# Patient Record
Sex: Male | Born: 1955 | Race: White | Hispanic: No | Marital: Single | Smoking: Never smoker
Health system: Southern US, Community
[De-identification: ages and names within clinical notes are randomized; demographics above are authoritative.]

## PROBLEM LIST (undated history)

## (undated) DIAGNOSIS — S069X9A Unspecified intracranial injury with loss of consciousness of unspecified duration, initial encounter: Secondary | ICD-10-CM

## (undated) DIAGNOSIS — S069XAA Unspecified intracranial injury with loss of consciousness status unknown, initial encounter: Secondary | ICD-10-CM

## (undated) HISTORY — PX: VASCULAR SURGERY: SHX849

---

## 2011-07-12 ENCOUNTER — Ambulatory Visit: Payer: Self-pay

## 2012-02-20 ENCOUNTER — Emergency Department: Payer: Self-pay | Admitting: Emergency Medicine

## 2014-08-07 LAB — CBC WITH DIFFERENTIAL/PLATELET
Basophil #: 0.1 10*3/uL (ref 0.0–0.1)
Basophil %: 1 %
EOS PCT: 4.1 %
Eosinophil #: 0.2 10*3/uL (ref 0.0–0.7)
HCT: 40.1 % (ref 40.0–52.0)
HGB: 13.8 g/dL (ref 13.0–18.0)
LYMPHS PCT: 55.5 %
Lymphocyte #: 3.3 10*3/uL (ref 1.0–3.6)
MCH: 31.5 pg (ref 26.0–34.0)
MCHC: 34.4 g/dL (ref 32.0–36.0)
MCV: 92 fL (ref 80–100)
MONO ABS: 0.7 x10 3/mm (ref 0.2–1.0)
Monocyte %: 11.9 %
NEUTROS ABS: 1.6 10*3/uL (ref 1.4–6.5)
NEUTROS PCT: 27.5 %
PLATELETS: 222 10*3/uL (ref 150–440)
RBC: 4.38 10*6/uL — ABNORMAL LOW (ref 4.40–5.90)
RDW: 13 % (ref 11.5–14.5)
WBC: 6 10*3/uL (ref 3.8–10.6)

## 2014-08-07 LAB — BASIC METABOLIC PANEL
ANION GAP: 9 (ref 7–16)
BUN: 7 mg/dL (ref 7–18)
CALCIUM: 8.9 mg/dL (ref 8.5–10.1)
CREATININE: 0.85 mg/dL (ref 0.60–1.30)
Chloride: 109 mmol/L — ABNORMAL HIGH (ref 98–107)
Co2: 24 mmol/L (ref 21–32)
EGFR (African American): >60
EGFR (Non-African Amer.): >60
GLUCOSE: 97 mg/dL (ref 65–99)
OSMOLALITY: 281 (ref 275–301)
Potassium: 4 mmol/L (ref 3.5–5.1)
Sodium: 142 mmol/L (ref 136–145)

## 2014-08-07 LAB — HEPATIC FUNCTION PANEL A (ARMC)
ALBUMIN: 3.5 g/dL (ref 3.4–5.0)
Alkaline Phosphatase: 57 U/L
BILIRUBIN TOTAL: 0.3 mg/dL (ref 0.2–1.0)
Bilirubin, Direct: <0.1 mg/dL (ref 0.00–0.20)
SGOT(AST): 33 U/L (ref 15–37)
SGPT (ALT): 31 U/L
Total Protein: 6.8 g/dL (ref 6.4–8.2)

## 2014-08-07 LAB — CK: CK, Total: 406 U/L — ABNORMAL HIGH

## 2014-08-07 LAB — TROPONIN I
Troponin-I: <0.02 ng/mL
Troponin-I: <0.02 ng/mL

## 2014-08-08 ENCOUNTER — Observation Stay: Payer: Self-pay | Admitting: Specialist

## 2014-08-08 LAB — CK TOTAL AND CKMB (NOT AT ARMC)
CK, Total: 399 U/L — ABNORMAL HIGH
CK, Total: 319 U/L — ABNORMAL HIGH
CK-MB: 7.9 ng/mL — AB (ref 0.5–3.6)
CK-MB: 5.8 ng/mL — ABNORMAL HIGH (ref 0.5–3.6)

## 2014-08-08 LAB — TROPONIN I: Troponin-I: <0.02 ng/mL

## 2014-08-08 LAB — CK-MB: CK-MB: 7.3 ng/mL — AB (ref 0.5–3.6)

## 2015-03-25 NOTE — Discharge Summary (Signed)
PATIENT NAME:  Anthony Middleton, Anthony Middleton MR#:  [id number] DATE OF BIRTH:  [birth year]  DATE OF ADMISSION:  08/08/2014 DATE OF DISCHARGE:  08/08/2014  For a detailed note, please take a look at the history and physical done on admission by Dr. Heron NayVasireddy.   DIAGNOSES AT DISCHARGE: Syncope, likely vasovagal in nature, history of bipolar disorder and depression, neuropathy, chronic pain.   The patient is being discharged on a regular diet.   ACTIVITY: As tolerated.   FOLLOWUP: With his primary care physician in the next 1-2 weeks.   DISCHARGE MEDICATIONS: Depakote 2 tabs at bedtime, Seroquel 2 tabs at bedtime, Celexa 2 tabs daily, gabapentin 500 mg t.i.d., fish oil daily, turmeric 2 caps daily, vitamin D2 1000 international units daily; potassium supplement 1 tab daily, multivitamin daily.   PERTINENT STUDIES DONE DURING THE HOSPITAL COURSE: Are as follows: A chest x-ray done on admission showing no acute cardiopulmonary process. An ultrasound of the carotids showing no evidence of any hemodynamically significant carotid artery stenosis. A 2-dimensional echocardiogram showing ejection fraction of 50-55% with low normal global LV systolic function.   HOSPITAL COURSE: This is a [age] year old male who presented to the hospital with a syncopal episode.   Problem #1:  Syncope. The most likely cause of the patient's syncope was vasovagal in nature. He had some subtle EKG changes, therefore, was observed overnight on telemetry. He had 3 sets of cardiac markers checked, which were negative. He had no evidence of any acute arrhythmias. He had a carotid duplex checked, which was negative. His echocardiogram showed normal LV systolic function with no structural heart disease. The patient has had no further episodes of syncope. His orthostatic vital signs are negative and, therefore, he is being discharged home.   Problem #2:  Bipolar disorder/depression. The patient was maintained on his Depakote and Seroquel. He will resume  that. Problem #3:  Neuropathy. The patient was maintained on Neurontin. He will resume that.  Problem #4:  Depression. The patient was maintained on Celexa and he will resume that upon discharge.   CODE STATUS: The patient is a full code.   Time spent on discharge was 35 minutes.    ____________________________ [name]. Cherlynn KaiserSainani, MD vjs:ts D: 08/08/2014 14:50:00 ET T: 08/08/2014 21:17:48 ET JOB#: [id number]  cc: [name]. Cherlynn KaiserSainani, MD, <Dictator> Houston SirenVIVEK Middleton [name] MD ELECTRONICALLY SIGNED 08/10/2014 15:55
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
## 2016-07-23 ENCOUNTER — Emergency Department
Admission: EM | Admit: 2016-07-23 | Discharge: 2016-07-23 | Disposition: A | Discharge status: Home / Self Care | Payer: Medicare Other | Attending: Emergency Medicine | Admitting: Emergency Medicine

## 2016-07-23 ENCOUNTER — Emergency Department: Payer: Medicare Other

## 2016-07-23 ENCOUNTER — Encounter: Payer: Self-pay | Admitting: Emergency Medicine

## 2016-07-23 DIAGNOSIS — Y939 Activity, unspecified: Secondary | ICD-10-CM | POA: Diagnosis not present

## 2016-07-23 DIAGNOSIS — S0993XA Unspecified injury of face, initial encounter: Secondary | ICD-10-CM | POA: Diagnosis present

## 2016-07-23 DIAGNOSIS — S0240EA Zygomatic fracture, right side, initial encounter for closed fracture: Secondary | ICD-10-CM | POA: Insufficient documentation

## 2016-07-23 DIAGNOSIS — R55 Syncope and collapse: Secondary | ICD-10-CM | POA: Insufficient documentation

## 2016-07-23 DIAGNOSIS — Y9289 Other specified places as the place of occurrence of the external cause: Secondary | ICD-10-CM | POA: Insufficient documentation

## 2016-07-23 DIAGNOSIS — S0292XA Unspecified fracture of facial bones, initial encounter for closed fracture: Secondary | ICD-10-CM

## 2016-07-23 DIAGNOSIS — Z23 Encounter for immunization: Secondary | ICD-10-CM | POA: Diagnosis not present

## 2016-07-23 DIAGNOSIS — Y999 Unspecified external cause status: Secondary | ICD-10-CM | POA: Insufficient documentation

## 2016-07-23 HISTORY — DX: Unspecified intracranial injury with loss of consciousness of unspecified duration, initial encounter: S06.9X9A

## 2016-07-23 HISTORY — DX: Unspecified intracranial injury with loss of consciousness status unknown, initial encounter: S06.9XAA

## 2016-07-23 MED ORDER — OXYCODONE-ACETAMINOPHEN 5-325 MG PO TABS
2.0000 | ORAL_TABLET | Freq: Once | ORAL | Status: AC
  Administered 2016-07-23: 2 via ORAL
  Filled 2016-07-23: qty 2

## 2016-07-23 MED ORDER — MORPHINE SULFATE (PF) 4 MG/ML IV SOLN
INTRAVENOUS | Status: AC
  Administered 2016-07-23: 4 mg via INTRAVENOUS
  Filled 2016-07-23: qty 1

## 2016-07-23 MED ORDER — SODIUM CHLORIDE 0.9 % IV BOLUS (SEPSIS)
1000.0000 mL | Freq: Once | INTRAVENOUS | Status: AC
  Administered 2016-07-23: 1000 mL via INTRAVENOUS

## 2016-07-23 MED ORDER — CEPHALEXIN 500 MG PO CAPS: 500.0000 mg | ORAL_CAPSULE | Freq: Four times a day (QID) | ORAL | 0 refills | Status: AC

## 2016-07-23 MED ORDER — TETANUS-DIPHTH-ACELL PERTUSSIS 5-2.5-18.5 LF-MCG/0.5 IM SUSP
0.5000 mL | Freq: Once | INTRAMUSCULAR | Status: AC
  Administered 2016-07-23: 0.5 mL via INTRAMUSCULAR
  Filled 2016-07-23: qty 0.5

## 2016-07-23 MED ORDER — MORPHINE SULFATE (PF) 4 MG/ML IV SOLN
4.0000 mg | Freq: Once | INTRAVENOUS | Status: AC
  Administered 2016-07-23: 4 mg via INTRAVENOUS

## 2016-07-23 MED ORDER — OXYCODONE-ACETAMINOPHEN 5-325 MG PO TABS: 1.0000 | ORAL_TABLET | Freq: Four times a day (QID) | ORAL | 0 refills | Status: DC | PRN

## 2016-07-23 MED ORDER — ONDANSETRON HCL 4 MG/2ML IJ SOLN
INTRAMUSCULAR | Status: AC
  Administered 2016-07-23: 4 mg via INTRAVENOUS
  Filled 2016-07-23: qty 2

## 2016-07-23 MED ORDER — ONDANSETRON HCL 4 MG/2ML IJ SOLN
4.0000 mg | Freq: Once | INTRAMUSCULAR | Status: AC
  Administered 2016-07-23: 4 mg via INTRAVENOUS

## 2016-07-23 MED ORDER — CEPHALEXIN 500 MG PO CAPS
500.0000 mg | ORAL_CAPSULE | Freq: Once | ORAL | Status: AC
  Administered 2016-07-23: 500 mg via ORAL
  Filled 2016-07-23: qty 1
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
## 2017-02-16 ENCOUNTER — Inpatient Hospital Stay
Admission: EM | Admit: 2017-02-16 | Discharge: 2017-02-18 | DRG: 149 | Disposition: A | Discharge status: Home / Self Care | Payer: Medicare Other | Attending: Internal Medicine | Admitting: Internal Medicine

## 2017-02-16 ENCOUNTER — Emergency Department: Payer: Medicare Other

## 2017-02-16 ENCOUNTER — Encounter: Payer: Self-pay | Admitting: Emergency Medicine

## 2017-02-16 DIAGNOSIS — Z888 Allergy status to other drugs, medicaments and biological substances status: Secondary | ICD-10-CM

## 2017-02-16 DIAGNOSIS — Z79899 Other long term (current) drug therapy: Secondary | ICD-10-CM

## 2017-02-16 DIAGNOSIS — R42 Dizziness and giddiness: Principal | ICD-10-CM | POA: Diagnosis present

## 2017-02-16 DIAGNOSIS — R269 Unspecified abnormalities of gait and mobility: Secondary | ICD-10-CM | POA: Diagnosis present

## 2017-02-16 DIAGNOSIS — H5500 Unspecified nystagmus: Secondary | ICD-10-CM | POA: Diagnosis present

## 2017-02-16 DIAGNOSIS — E876 Hypokalemia: Secondary | ICD-10-CM | POA: Diagnosis present

## 2017-02-16 DIAGNOSIS — Z87820 Personal history of traumatic brain injury: Secondary | ICD-10-CM

## 2017-02-16 DIAGNOSIS — R112 Nausea with vomiting, unspecified: Secondary | ICD-10-CM | POA: Diagnosis present

## 2017-02-16 LAB — CBC WITH DIFFERENTIAL/PLATELET
BASOS PCT: 1 %
Basophils Absolute: 0 10*3/uL (ref 0–0.1)
EOS ABS: 0 10*3/uL (ref 0–0.7)
Eosinophils Relative: 0 %
HEMATOCRIT: 44.5 % (ref 40.0–52.0)
HEMOGLOBIN: 15.7 g/dL (ref 13.0–18.0)
LYMPHS ABS: 1.9 10*3/uL (ref 1.0–3.6)
Lymphocytes Relative: 25 %
MCH: 31.1 pg (ref 26.0–34.0)
MCHC: 35.3 g/dL (ref 32.0–36.0)
MCV: 88.1 fL (ref 80.0–100.0)
MONO ABS: 1.3 10*3/uL — AB (ref 0.2–1.0)
MONOS PCT: 16 %
NEUTROS ABS: 4.5 10*3/uL (ref 1.4–6.5)
NEUTROS PCT: 58 %
Platelets: 261 10*3/uL (ref 150–440)
RBC: 5.06 MIL/uL (ref 4.40–5.90)
RDW: 12.9 % (ref 11.5–14.5)
WBC: 7.8 10*3/uL (ref 3.8–10.6)

## 2017-02-16 LAB — COMPREHENSIVE METABOLIC PANEL
ALBUMIN: 4.7 g/dL (ref 3.5–5.0)
ALK PHOS: 64 U/L (ref 38–126)
ALT: 37 U/L (ref 17–63)
ANION GAP: 11 (ref 5–15)
AST: 37 U/L (ref 15–41)
BILIRUBIN TOTAL: 0.8 mg/dL (ref 0.3–1.2)
BUN: 13 mg/dL (ref 6–20)
CALCIUM: 9.9 mg/dL (ref 8.9–10.3)
CO2: 18 mmol/L — ABNORMAL LOW (ref 22–32)
Chloride: 108 mmol/L (ref 101–111)
Creatinine, Ser: 0.74 mg/dL (ref 0.61–1.24)
GFR calc non Af Amer: >60 mL/min (ref >60)
GLUCOSE: 115 mg/dL — AB (ref 65–99)
POTASSIUM: 3.4 mmol/L — AB (ref 3.5–5.1)
Sodium: 137 mmol/L (ref 135–145)
TOTAL PROTEIN: 8.5 g/dL — AB (ref 6.5–8.1)

## 2017-02-16 LAB — TROPONIN I: Troponin I: <0.03 ng/mL (ref <0.03)

## 2017-02-16 MED ORDER — IOPAMIDOL (ISOVUE-370) INJECTION 76%
75.0000 mL | Freq: Once | INTRAVENOUS | Status: AC | PRN
  Administered 2017-02-16: 75 mL via INTRAVENOUS

## 2017-02-16 MED ORDER — ONDANSETRON HCL 4 MG/2ML IJ SOLN
4.0000 mg | Freq: Once | INTRAMUSCULAR | Status: AC
  Administered 2017-02-16: 4 mg via INTRAVENOUS

## 2017-02-16 MED ORDER — PROMETHAZINE HCL 25 MG/ML IJ SOLN
12.5000 mg | Freq: Once | INTRAMUSCULAR | Status: AC
  Administered 2017-02-16: 12.5 mg via INTRAVENOUS
  Filled 2017-02-16: qty 1

## 2017-02-16 MED ORDER — MECLIZINE HCL 25 MG PO TABS
25.0000 mg | ORAL_TABLET | Freq: Once | ORAL | Status: AC
  Administered 2017-02-16: 25 mg via ORAL
  Filled 2017-02-16: qty 1

## 2017-02-16 MED ORDER — ONDANSETRON HCL 4 MG/2ML IJ SOLN
INTRAMUSCULAR | Status: AC
  Administered 2017-02-16: 4 mg via INTRAVENOUS
  Filled 2017-02-16: qty 2

## 2017-02-16 MED ORDER — SODIUM CHLORIDE 0.9 % IV BOLUS (SEPSIS)
1000.0000 mL | Freq: Once | INTRAVENOUS | Status: AC
  Administered 2017-02-16: 1000 mL via INTRAVENOUS

## 2017-02-16 NOTE — ED Notes (Signed)
Pt reports last drink was 3 days ago but denies drinking daily.

## 2017-02-16 NOTE — ED Notes (Signed)
ED Provider at bedside. 
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
## 2017-02-17 ENCOUNTER — Observation Stay: Payer: Medicare Other

## 2017-02-17 ENCOUNTER — Encounter: Payer: Self-pay | Admitting: Internal Medicine

## 2017-02-17 DIAGNOSIS — R269 Unspecified abnormalities of gait and mobility: Secondary | ICD-10-CM | POA: Diagnosis present

## 2017-02-17 DIAGNOSIS — H5500 Unspecified nystagmus: Secondary | ICD-10-CM | POA: Diagnosis present

## 2017-02-17 DIAGNOSIS — R42 Dizziness and giddiness: Secondary | ICD-10-CM

## 2017-02-17 DIAGNOSIS — Z79899 Other long term (current) drug therapy: Secondary | ICD-10-CM | POA: Diagnosis not present

## 2017-02-17 DIAGNOSIS — R112 Nausea with vomiting, unspecified: Secondary | ICD-10-CM | POA: Diagnosis present

## 2017-02-17 DIAGNOSIS — Z888 Allergy status to other drugs, medicaments and biological substances status: Secondary | ICD-10-CM | POA: Diagnosis not present

## 2017-02-17 DIAGNOSIS — E876 Hypokalemia: Secondary | ICD-10-CM | POA: Diagnosis present

## 2017-02-17 DIAGNOSIS — Z87820 Personal history of traumatic brain injury: Secondary | ICD-10-CM | POA: Diagnosis not present

## 2017-02-17 LAB — CBC
HCT: 39.8 % — ABNORMAL LOW (ref 40.0–52.0)
Hemoglobin: 13.7 g/dL (ref 13.0–18.0)
MCH: 31.3 pg (ref 26.0–34.0)
MCHC: 34.5 g/dL (ref 32.0–36.0)
MCV: 90.8 fL (ref 80.0–100.0)
Platelets: 213 10*3/uL (ref 150–440)
RBC: 4.39 MIL/uL — AB (ref 4.40–5.90)
RDW: 13.2 % (ref 11.5–14.5)
WBC: 6.7 10*3/uL (ref 3.8–10.6)

## 2017-02-17 LAB — PHENYTOIN LEVEL, TOTAL

## 2017-02-17 LAB — CREATININE, SERUM
Creatinine, Ser: 0.74 mg/dL (ref 0.61–1.24)
GFR calc Af Amer: >60 mL/min (ref >60)
GFR calc non Af Amer: >60 mL/min (ref >60)

## 2017-02-17 LAB — TSH: TSH: 1.882 u[IU]/mL (ref 0.350–4.500)

## 2017-02-17 MED ORDER — OXYCODONE-ACETAMINOPHEN 5-325 MG PO TABS
1.0000 | ORAL_TABLET | Freq: Four times a day (QID) | ORAL | Status: DC | PRN
  Administered 2017-02-17: 1 via ORAL
  Administered 2017-02-17 – 2017-02-18 (×2): 2 via ORAL
  Filled 2017-02-17: qty 2
  Filled 2017-02-17: qty 1
  Filled 2017-02-17: qty 2

## 2017-02-17 MED ORDER — POTASSIUM CHLORIDE CRYS ER 20 MEQ PO TBCR
20.0000 meq | EXTENDED_RELEASE_TABLET | Freq: Once | ORAL | Status: AC
  Administered 2017-02-17: 20 meq via ORAL
  Filled 2017-02-17: qty 1

## 2017-02-17 MED ORDER — ACETAMINOPHEN 325 MG PO TABS: 650.0000 mg | ORAL_TABLET | Freq: Four times a day (QID) | ORAL | Status: DC | PRN

## 2017-02-17 MED ORDER — MECLIZINE HCL 25 MG PO TABS
25.0000 mg | ORAL_TABLET | Freq: Three times a day (TID) | ORAL | Status: DC
  Administered 2017-02-17 – 2017-02-18 (×5): 25 mg via ORAL
  Filled 2017-02-17 (×6): qty 1

## 2017-02-17 MED ORDER — LORAZEPAM 1 MG PO TABS
1.0000 mg | ORAL_TABLET | Freq: Once | ORAL | Status: AC
  Administered 2017-02-17: 1 mg via ORAL
  Filled 2017-02-17: qty 1

## 2017-02-17 MED ORDER — ACETAMINOPHEN 650 MG RE SUPP: 650.0000 mg | Freq: Four times a day (QID) | RECTAL | Status: DC | PRN

## 2017-02-17 MED ORDER — SODIUM CHLORIDE 0.9 % IV SOLN
INTRAVENOUS | Status: DC
  Administered 2017-02-17 – 2017-02-18 (×4): via INTRAVENOUS

## 2017-02-17 MED ORDER — ASPIRIN 81 MG PO CHEW
324.0000 mg | CHEWABLE_TABLET | Freq: Once | ORAL | Status: AC
  Administered 2017-02-17: 324 mg via ORAL
  Filled 2017-02-17: qty 4

## 2017-02-17 MED ORDER — SENNOSIDES-DOCUSATE SODIUM 8.6-50 MG PO TABS: 1.0000 | ORAL_TABLET | Freq: Every evening | ORAL | Status: DC | PRN

## 2017-02-17 MED ORDER — SODIUM CHLORIDE 0.9% FLUSH
3.0000 mL | Freq: Two times a day (BID) | INTRAVENOUS | Status: DC
  Administered 2017-02-17 – 2017-02-18 (×3): 3 mL via INTRAVENOUS

## 2017-02-17 MED ORDER — SODIUM CHLORIDE 0.9 % IV SOLN: 250.0000 mL | INTRAVENOUS | Status: DC | PRN

## 2017-02-17 MED ORDER — SODIUM CHLORIDE 0.9% FLUSH: 3.0000 mL | INTRAVENOUS | Status: DC | PRN

## 2017-02-17 MED ORDER — ONDANSETRON HCL 4 MG PO TABS: 4.0000 mg | ORAL_TABLET | Freq: Four times a day (QID) | ORAL | Status: DC | PRN

## 2017-02-17 MED ORDER — ENOXAPARIN SODIUM 40 MG/0.4ML ~~LOC~~ SOLN
40.0000 mg | SUBCUTANEOUS | Status: DC
  Administered 2017-02-17: 40 mg via SUBCUTANEOUS
  Filled 2017-02-17: qty 0.4

## 2017-02-17 MED ORDER — ONDANSETRON HCL 4 MG/2ML IJ SOLN: 4.0000 mg | Freq: Four times a day (QID) | INTRAMUSCULAR | Status: DC | PRN

## 2017-02-17 MED ORDER — HALOPERIDOL LACTATE 5 MG/ML IJ SOLN
2.0000 mg | Freq: Once | INTRAMUSCULAR | Status: AC
  Administered 2017-02-17: 2 mg via INTRAVENOUS
  Filled 2017-02-17: qty 1

## 2017-02-17 NOTE — ED Notes (Signed)
Pt given crackers and water. 

## 2017-02-17 NOTE — ED Notes (Signed)
Attempted to call report; nurse to call me back. 

## 2017-02-17 NOTE — ED Notes (Signed)
Pt able to tolerate crackers and water. Pt denies any nausea.

## 2017-02-17 NOTE — H&P (Signed)
[facility] [place] [facility]   PATIENT NAME: Anthony Middleton    MR#:  [id number]  DATE OF BIRTH:  1956/02/21  DATE OF ADMISSION:  02/16/2017  PRIMARY CARE PHYSICIAN: Clayborn Heron, MD   REQUESTING/REFERRING PHYSICIAN:   CHIEF COMPLAINT:   Chief Complaint  Patient presents with  . Dizziness  . Chills  . Emesis    HISTORY OF PRESENT ILLNESS: Anthony Middleton  is a 61 y.o. male with a known history of Traumatic brain injury presented to the emergency room with dizziness since last couple of days. Patient dizziness started Friday evening after mowing the lawn and it's been persistent. The dizziness comes and severe episodes and patient feels that objects around him are spinning around. Not able to stand and walk because of the severe dizziness and vertigo. No complaints of any runny nose, headache and fever. He has nystagmus when he was examined in the emergency room. No fullness in the ears or any difficulty in hearing sensation. No numbness, tingling sensation in any part of the body. No weakness in any part of the body. Patient also has some nausea and an episode of vomiting. Hospitalist service was consulted for further care of the patient. He was worked up with CT angiogram of the head which showed no acute intracranial abnormality.  PAST MEDICAL HISTORY:   Past Medical History:  Diagnosis Date  . Traumatic brain injury [facility])     PAST SURGICAL HISTORY: Past Surgical History:  Procedure Laterality Date  . VASCULAR SURGERY      SOCIAL HISTORY:  Social History  Substance Use Topics  . Smoking status: Never Smoker  . Smokeless tobacco: Never Used  . Alcohol use 7.2 oz/week    12 Cans of beer per week    FAMILY HISTORY:  Family History  Problem Relation Age of Onset  . Hypertension Mother   . Hypertension Father     DRUG ALLERGIES:  Allergies  Allergen Reactions  . Tramadol     REVIEW OF SYSTEMS:   CONSTITUTIONAL: No fever, fatigue or  weakness.  EYES: No blurred or double vision.  EARS, NOSE, AND THROAT: No tinnitus or ear pain.  RESPIRATORY: No cough, shortness of breath, wheezing or hemoptysis.  CARDIOVASCULAR: No chest pain, orthopnea, edema.  GASTROINTESTINAL: Has nausea, vomiting,  No diarrhea or abdominal pain.  GENITOURINARY: No dysuria, hematuria.  ENDOCRINE: No polyuria, nocturia,  HEMATOLOGY: No anemia, easy bruising or bleeding SKIN: No rash or lesion. MUSCULOSKELETAL: No joint pain or arthritis.   NEUROLOGIC: No tingling, numbness, weakness.  Has vertigo. PSYCHIATRY: No anxiety or depression.   MEDICATIONS AT HOME:  Prior to Admission medications   Medication Sig Start Date End Date Taking? Authorizing Provider  oxyCODONE-acetaminophen (ROXICET) 5-325 MG tablet Take 1-2 tablets by mouth every 6 (six) hours as needed. 07/23/16   Myrna Blazer, MD      PHYSICAL EXAMINATION:   VITAL SIGNS: Blood pressure (!) 144/91, pulse 79, temperature 98.2 F (36.8 C), temperature source Oral, resp. rate 16, height 6\' 3"  (1.905 m), weight 102.1 kg (225 lb), SpO2 96 %.  GENERAL:  61 y.o.-year-old patient lying in the bed with no acute distress.  EYES: Pupils equal, round, reactive to light and accommodation. No scleral icterus. Extraocular muscles intact.  HEENT: Head atraumatic, normocephalic. Oropharynx and nasopharynx clear.  NECK:  Supple, no jugular venous distention. No thyroid enlargement, no tenderness.  LUNGS: Normal breath sounds bilaterally, no wheezing, rales,rhonchi or crepitation. No use of accessory  muscles of respiration.  CARDIOVASCULAR: S1, S2 normal. No murmurs, rubs, or gallops.  ABDOMEN: Soft, nontender, nondistended. Bowel sounds present. No organomegaly or mass.  EXTREMITIES: No pedal edema, cyanosis, or clubbing.  NEUROLOGIC: Cranial nerves II through XII are intact. Muscle strength 5/5 in all extremities. Sensation intact. Gait not checked.  PSYCHIATRIC: The patient is alert and  oriented x 3.  SKIN: No obvious rash, lesion, or ulcer.   LABORATORY PANEL:   CBC  Recent Labs Lab 02/16/17 2157  WBC 7.8  HGB 15.7  HCT 44.5  PLT 261  MCV 88.1  MCH 31.1  MCHC 35.3  RDW 12.9  LYMPHSABS 1.9  MONOABS 1.3*  EOSABS 0.0  BASOSABS 0.0   ------------------------------------------------------------------------------------------------------------------  Chemistries   Recent Labs Lab 02/16/17 2157  NA 137  K 3.4*  CL 108  CO2 18*  GLUCOSE 115*  BUN 13  CREATININE 0.74  CALCIUM 9.9  AST 37  ALT 37  ALKPHOS 64  BILITOT 0.8   ------------------------------------------------------------------------------------------------------------------ estimated creatinine clearance is 130.3 mL/min (by C-G formula based on SCr of 0.74 mg/dL). ------------------------------------------------------------------------------------------------------------------ No results for input(s): TSH, T4TOTAL, T3FREE, THYROIDAB in the last 72 hours.  Invalid input(s): FREET3   Coagulation profile No results for input(s): INR, PROTIME in the last 168 hours. ------------------------------------------------------------------------------------------------------------------- No results for input(s): DDIMER in the last 72 hours. -------------------------------------------------------------------------------------------------------------------  Cardiac Enzymes  Recent Labs Lab 02/16/17 2157  TROPONINI <0.03   ------------------------------------------------------------------------------------------------------------------ Invalid input(s): POCBNP  ---------------------------------------------------------------------------------------------------------------  Urinalysis No results found for: COLORURINE, APPEARANCEUR, LABSPEC, PHURINE, GLUCOSEU, HGBUR, BILIRUBINUR, KETONESUR, PROTEINUR, UROBILINOGEN, NITRITE, LEUKOCYTESUR   RADIOLOGY: Ct Angio Head W Or Wo Contrast  Result  Date: 02/16/2017 CLINICAL DATA:  Vertigo EXAM: CT ANGIOGRAPHY HEAD TECHNIQUE: Multidetector CT imaging of the head was performed using the standard protocol during bolus administration of intravenous contrast. Multiplanar CT image reconstructions and MIPs were obtained to evaluate the vascular anatomy. CONTRAST:  75 mL Isovue 370 IV COMPARISON:  Head CT 07/23/2016 FINDINGS: CT HEAD Brain: No mass lesion, intraparenchymal hemorrhage or extra-axial collection. No evidence of acute cortical infarct. Brain parenchyma and CSF-containing spaces are normal for age. Vascular: No hyperdense vessel or unexpected calcification. Skull: Irregularity of the maxillary sinus walls secondary to remote trauma. Sinuses/Orbits: No sinus fluid levels or advanced mucosal thickening. No mastoid effusion. Normal orbits. CTA HEAD Anterior circulation: --Intracranial internal carotid arteries: Normal. --Anterior cerebral arteries: Normal. --Middle cerebral arteries: Normal. --Posterior communicating arteries: Present on the right. Posterior circulation: --Posterior cerebral arteries: Normal. --Superior cerebellar arteries: Normal. --Basilar artery: There is mild narrowing of the distal basilar artery. --Anterior inferior cerebellar arteries: Not clearly visualized, which is not uncommon. --Posterior inferior cerebellar arteries: Normal. Venous sinuses: As permitted by contrast timing, patent. Anatomic variants: None Delayed phase: No abnormal intracranial enhancement. IMPRESSION: 1. No acute intracranial abnormality. 2. Normal CTA of the intracranial circulation. Electronically Signed   By: Deatra RobinsonKevin  Herman M.D.   On: 02/16/2017 23:57    EKG: Orders placed or performed during the hospital encounter of 02/16/17  . ED EKG  . ED EKG  . EKG 12-Lead  . EKG 12-Lead    IMPRESSION AND PLAN: 61 year old male patient presented to the emergency room with severe dizziness, nausea. Admitting diagnosis 1. Vertigo 2. Gait instability 3. Nausea  and vomiting 4. Hypokalemia Treatment plan Admit patient to medical floor observation bed IV fluid hydration Oral meclizine 25 MG every 8 hourly for vertigo If no response with the medication will get MRI of the brain Replace potassium orally Antiemetic  medication DVT prophylaxis subcutaneous Lovenox 40 MG daily  All the records are reviewed and case discussed with ED provider. Management plans discussed with the patient, family and they are in agreement.  CODE STATUS:FULL CODE Code Status History    This patient does not have a recorded code status. Please follow your organizational policy for patients in this situation.       TOTAL TIME TAKING CARE OF THIS PATIENT: 50 minutes.    Ihor Austin M.D on 02/17/2017 at 6:14 AM  Between 7am to 6pm - Pager - [phone]  After 6pm go to [email or web address] - password [email or web address] [facility]  [facility] [place] [facility]  Office  [phone]  CC: Primary care physician; Clayborn Heron, MD

## 2017-02-17 NOTE — ED Notes (Signed)
Pt unable to walk, when pt stood up and became dizzy. Pt reports feeling too dizzy to ambulate. Pt assisted back into a safe position in bed.

## 2017-02-17 NOTE — ED Notes (Signed)
EDP gave verbal order for pt to have MRI after admission.

## 2017-02-17 NOTE — Progress Notes (Signed)
[facility] - [place] at [facility]   PATIENT NAME: Liz MaladyGuy Shen    MR#:  [id number]  DATE OF BIRTH:  02/22/1956  SUBJECTIVE:  CHIEF COMPLAINT:   Chief Complaint  Patient presents with  . Dizziness  . Chills  . Emesis  still feels very dizzi and afraid to get up REVIEW OF SYSTEMS:  Review of Systems  Constitutional: Positive for malaise/fatigue. Negative for chills, fever and weight loss.  HENT: Negative for nosebleeds and sore throat.   Eyes: Negative for blurred vision.  Respiratory: Negative for cough, shortness of breath and wheezing.   Cardiovascular: Negative for chest pain, orthopnea, leg swelling and PND.  Gastrointestinal: Negative for abdominal pain, constipation, diarrhea, heartburn, nausea and vomiting.  Genitourinary: Negative for dysuria and urgency.  Musculoskeletal: Negative for back pain.  Skin: Negative for rash.  Neurological: Positive for dizziness and weakness. Negative for speech change, focal weakness and headaches.  Endo/Heme/Allergies: Does not bruise/bleed easily.  Psychiatric/Behavioral: Negative for depression.   DRUG ALLERGIES:   Allergies  Allergen Reactions  . Tramadol Nausea And Vomiting   VITALS:  Blood pressure (!) 144/90, pulse 67, temperature 98.4 F (36.9 C), temperature source Oral, resp. rate 20, height 6\' 3"  (1.905 m), weight 107 kg (236 lb), SpO2 95 %. PHYSICAL EXAMINATION:  Physical Exam  Constitutional: He is oriented to person, place, and time and well-developed, well-nourished, and in no distress.  HENT:  Head: Normocephalic and atraumatic.  Eyes: Conjunctivae and EOM are normal. Pupils are equal, round, and reactive to light.  Neck: Normal range of motion. Neck supple. No tracheal deviation present. No thyromegaly present.  Cardiovascular: Normal rate, regular rhythm and normal heart sounds.   Pulmonary/Chest: Effort normal and breath sounds normal. No respiratory distress. He has no wheezes. He exhibits no  tenderness.  Abdominal: Soft. Bowel sounds are normal. He exhibits no distension. There is no tenderness.  Musculoskeletal: Normal range of motion.  Neurological: He is alert and oriented to person, place, and time. No cranial nerve deficit.  Skin: Skin is warm and dry. No rash noted.  Psychiatric: Mood and affect normal.   LABORATORY PANEL:  Male CBC  Recent Labs Lab 02/17/17 1128  WBC 6.7  HGB 13.7  HCT 39.8*  PLT 213   ------------------------------------------------------------------------------------------------------------------ Chemistries   Recent Labs Lab 02/16/17 2157 02/17/17 1128  NA 137  --   K 3.4*  --   CL 108  --   CO2 18*  --   GLUCOSE 115*  --   BUN 13  --   CREATININE 0.74 0.74  CALCIUM 9.9  --   AST 37  --   ALT 37  --   ALKPHOS 64  --   BILITOT 0.8  --    RADIOLOGY:  Ct Angio Head W Or Wo Contrast  Result Date: 02/16/2017 CLINICAL DATA:  Vertigo EXAM: CT ANGIOGRAPHY HEAD TECHNIQUE: Multidetector CT imaging of the head was performed using the standard protocol during bolus administration of intravenous contrast. Multiplanar CT image reconstructions and MIPs were obtained to evaluate the vascular anatomy. CONTRAST:  75 mL Isovue 370 IV COMPARISON:  Head CT 07/23/2016 FINDINGS: CT HEAD Brain: No mass lesion, intraparenchymal hemorrhage or extra-axial collection. No evidence of acute cortical infarct. Brain parenchyma and CSF-containing spaces are normal for age. Vascular: No hyperdense vessel or unexpected calcification. Skull: Irregularity of the maxillary sinus walls secondary to remote trauma. Sinuses/Orbits: No sinus fluid levels or advanced mucosal thickening. No mastoid effusion. Normal orbits. CTA HEAD  Anterior circulation: --Intracranial internal carotid arteries: Normal. --Anterior cerebral arteries: Normal. --Middle cerebral arteries: Normal. --Posterior communicating arteries: Present on the right. Posterior circulation: --Posterior cerebral  arteries: Normal. --Superior cerebellar arteries: Normal. --Basilar artery: There is mild narrowing of the distal basilar artery. --Anterior inferior cerebellar arteries: Not clearly visualized, which is not uncommon. --Posterior inferior cerebellar arteries: Normal. Venous sinuses: As permitted by contrast timing, patent. Anatomic variants: None Delayed phase: No abnormal intracranial enhancement. IMPRESSION: 1. No acute intracranial abnormality. 2. Normal CTA of the intracranial circulation. Electronically Signed   By: Deatra Robinson M.D.   On: 02/16/2017 23:57   Mr Brain Wo Contrast  Result Date: 02/17/2017 CLINICAL DATA:  Four-day history of vertigo and gait disturbance. EXAM: MRI HEAD WITHOUT CONTRAST TECHNIQUE: Multiplanar, multiecho pulse sequences of the brain and surrounding structures were obtained without intravenous contrast. COMPARISON:  CT 02/16/2017 and 07/23/2016. FINDINGS: Brain: Diffusion imaging is negative for acute or subacute infarction. The brainstem is normal. 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 Cerebral hemispheres show mild chronic small-vessel change of the white matter. No cortical or large vessel territory insult. No mass lesion, hemorrhage, hydrocephalus or extra-axial collection. Vascular: Major vessels at the base of the brain show flow. Skull and upper cervical spine: Negative Sinuses/Orbits: Retention cyst right maxillary sinus. No active sinus inflammatory disease. No fluid in the middle ears or mastoids. Orbits negative. 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 IMPRESSION: No acute finding. 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 Mild small vessel change of the cerebral hemispheric white matter. Electronically Signed   By: Paulina Fusi M.D.   On: 02/17/2017 11:33   ASSESSMENT AND PLAN:  61 year old male patient admitted for severe dizziness, nausea.  * Persistent Vertigo and dizziness - MRI neg - Neuro c/s - check orthostatics -  check TSH - Oral meclizine trial - 25 MG every 8 hourly for vertigo  * Gait instability - PT, OT c/s  * Nausea and vomiting - resolved  * Hypokalemia - replete and recheck      All the records are reviewed and case discussed with Care Management/Social Worker. Management plans discussed with the patient, nursing and they are in agreement.  CODE STATUS: Full Code  TOTAL TIME TAKING CARE OF THIS PATIENT: 25 minutes.   More than 50% of the time was spent in counseling/coordination of care: YES  POSSIBLE D/C IN 1-2 DAYS, DEPENDING ON CLINICAL CONDITION.   Delfino Lovett M.D on 02/17/2017 at 3:23 PM  Between 7am to 6pm - Pager - [phone]  After 6pm go to [email or web address] - Social research officer, government  [facility]  Office  [phone]  CC: Primary care physician; Clayborn Heron, MD  Note: This dictation was prepared with Dragon dictation along with smaller phrase technology. Any transcriptional errors that result from this process are unintentional.

## 2017-02-18 DIAGNOSIS — R42 Dizziness and giddiness: Principal | ICD-10-CM

## 2017-02-18 LAB — COMPREHENSIVE METABOLIC PANEL
ALBUMIN: 3.9 g/dL (ref 3.5–5.0)
ALK PHOS: 44 U/L (ref 38–126)
ALT: 30 U/L (ref 17–63)
AST: 24 U/L (ref 15–41)
Anion gap: 7 (ref 5–15)
BILIRUBIN TOTAL: 0.8 mg/dL (ref 0.3–1.2)
BUN: 15 mg/dL (ref 6–20)
CO2: 24 mmol/L (ref 22–32)
Calcium: 8.8 mg/dL — ABNORMAL LOW (ref 8.9–10.3)
Chloride: 107 mmol/L (ref 101–111)
Creatinine, Ser: 0.61 mg/dL (ref 0.61–1.24)
GFR calc Af Amer: >60 mL/min (ref >60)
GFR calc non Af Amer: >60 mL/min (ref >60)
GLUCOSE: 96 mg/dL (ref 65–99)
POTASSIUM: 3.6 mmol/L (ref 3.5–5.1)
Sodium: 138 mmol/L (ref 135–145)
TOTAL PROTEIN: 6.6 g/dL (ref 6.5–8.1)

## 2017-02-18 LAB — CBC
HEMATOCRIT: 39 % — AB (ref 40.0–52.0)
HEMOGLOBIN: 13.9 g/dL (ref 13.0–18.0)
MCH: 31.9 pg (ref 26.0–34.0)
MCHC: 35.6 g/dL (ref 32.0–36.0)
MCV: 89.6 fL (ref 80.0–100.0)
Platelets: 204 10*3/uL (ref 150–440)
RBC: 4.35 MIL/uL — ABNORMAL LOW (ref 4.40–5.90)
RDW: 13.5 % (ref 11.5–14.5)
WBC: 8.9 10*3/uL (ref 3.8–10.6)

## 2017-02-18 MED ORDER — DIAZEPAM 5 MG PO TABS
5.0000 mg | ORAL_TABLET | Freq: Three times a day (TID) | ORAL | Status: DC | PRN
  Administered 2017-02-18: 5 mg via ORAL
  Filled 2017-02-18: qty 1

## 2017-02-18 MED ORDER — QUETIAPINE FUMARATE 100 MG PO TABS
100.0000 mg | ORAL_TABLET | ORAL | Status: AC
  Administered 2017-02-18: 100 mg via ORAL
  Filled 2017-02-18: qty 1

## 2017-02-18 MED ORDER — PHENYTOIN SODIUM EXTENDED 100 MG PO CAPS: 200.0000 mg | ORAL_CAPSULE | Freq: Every day | ORAL | Status: DC

## 2017-02-18 MED ORDER — CITALOPRAM HYDROBROMIDE 20 MG PO TABS
40.0000 mg | ORAL_TABLET | ORAL | Status: AC
  Administered 2017-02-18: 13:00:00 40 mg via ORAL
  Filled 2017-02-18: qty 2

## 2017-02-18 MED ORDER — MECLIZINE HCL 25 MG PO TABS: 25.0000 mg | ORAL_TABLET | Freq: Three times a day (TID) | ORAL | 0 refills | Status: DC | PRN

## 2017-02-18 MED ORDER — DIAZEPAM 5 MG PO TABS: 5.0000 mg | ORAL_TABLET | Freq: Three times a day (TID) | ORAL | 0 refills | Status: AC | PRN

## 2017-02-18 MED ORDER — PHENYTOIN SODIUM EXTENDED 200 MG PO CAPS: 200.0000 mg | ORAL_CAPSULE | Freq: Every day | ORAL | 0 refills | Status: AC

## 2017-02-18 NOTE — Consult Note (Signed)
Reason for Consult:Dizziness Referring Physician: Sherryll Burger  CC: Dizziness  HPI: Anthony Middleton is an 61 y.o. male with a history of TBI who reports that at 1730 on Sunday while talking to friends he had the acute onset of dizziness that he describes as the room spinning.  He reports that the dizziness is so severe that he can not walk without assistance.  Meclizine has been tried for relief of his symptoms with no improvement.  Symptoms are better when he is lying down.    Past Medical History:  Diagnosis Date  . Traumatic brain injury [facility])     Past Surgical History:  Procedure Laterality Date  . VASCULAR SURGERY      Family History  Problem Relation Age of Onset  . Hypertension Mother   . Hypertension Father     Social History:  reports that he has never smoked. He has never used smokeless tobacco. He reports that he drinks about 7.2 oz of alcohol per week . He reports that he does not use drugs.  Allergies  Allergen Reactions  . Tramadol Nausea And Vomiting    Medications:  I have reviewed the patient's current medications. Prior to Admission:  Prescriptions Prior to Admission  Medication Sig Dispense Refill Last Dose  . citalopram (CELEXA) 40 MG tablet Take 40 mg by mouth daily.   02/16/2017 at Unknown time  . phenytoin (DILANTIN) 100 MG ER capsule Take 200 mg by mouth at bedtime.   02/16/2017 at Unknown time  . QUEtiapine (SEROQUEL) 100 MG tablet Take 100 mg by mouth daily.   02/16/2017 at Unknown time   Scheduled: . enoxaparin (LOVENOX) injection  40 mg Subcutaneous Q24H  . meclizine  25 mg Oral TID  . sodium chloride flush  3 mL Intravenous Q12H    ROS: History obtained from the patient  General ROS: negative for - chills, fatigue, fever, night sweats, weight gain or weight loss Psychological ROS: negative for - behavioral disorder, hallucinations, memory difficulties, mood swings or suicidal ideation Ophthalmic ROS: negative for - blurry vision, double vision, eye  pain or loss of vision ENT ROS: negative for - epistaxis, nasal discharge, oral lesions, sore throat, tinnitus Allergy and Immunology ROS: negative for - hives or itchy/watery eyes Hematological and Lymphatic ROS: negative for - bleeding problems, bruising or swollen lymph nodes Endocrine ROS: negative for - galactorrhea, hair pattern changes, polydipsia/polyuria or temperature intolerance Respiratory ROS: negative for - cough, hemoptysis, shortness of breath or wheezing Cardiovascular ROS: negative for - chest pain, dyspnea on exertion, edema or irregular heartbeat Gastrointestinal ROS: negative for - abdominal pain, diarrhea, hematemesis, nausea/vomiting or stool incontinence Genito-Urinary ROS: negative for - dysuria, hematuria, incontinence or urinary frequency/urgency Musculoskeletal ROS: negative for - joint swelling or muscular weakness Neurological ROS: as noted in HPI Dermatological ROS: negative for rash and skin lesion changes  Physical Examination: Blood pressure 134/82, pulse 64, temperature 98.2 F (36.8 C), temperature source Oral, resp. rate 20, height 6\' 3"  (1.905 m), weight 107 kg (236 lb), SpO2 99 %.  HEENT-  Normocephalic, no lesions, without obvious abnormality.  Normal external eye and conjunctiva.  Normal TM's bilaterally.  Normal auditory canals and external ears. Normal external nose, mucus membranes and septum.  Normal pharynx. Cardiovascular- S1, S2 normal, pulses palpable throughout   Lungs- chest clear, no wheezing, rales, normal symmetric air entry Abdomen- soft, non-tender; bowel sounds normal; no masses,  no organomegaly Extremities- no edema Lymph-no adenopathy palpable Musculoskeletal-no joint tenderness, deformity or swelling Skin-warm and  dry, no hyperpigmentation, vitiligo, or suspicious lesions  Neurological Examination   Mental Status: Alert, oriented, thought content appropriate.  Speech fluent without evidence of aphasia.  Able to follow 3 step  commands without difficulty. Cranial Nerves: II: Discs flat bilaterally; Visual fields grossly normal, pupils equal, round, reactive to light and accommodation III,IV, VI: ptosis not present, extra-ocular motions intact bilaterally with nystagmus on right lateral gaze and upward gaze V,VII: right facial droop, facial light touch sensation decreased on the right VIII: hearing normal bilaterally IX,X: gag reflex present XI: bilateral shoulder shrug XII: midline tongue extension Motor: Right : Upper extremity   5/5    Left:     Upper extremity   5/5  Lower extremity   5/5     Lower extremity   5/5 Tone and bulk:normal tone throughout; no atrophy noted Sensory: Pinprick and light touch intact throughout, bilaterally Deep Tendon Reflexes: 2+ with absent right AJ Plantars: Right: downgoing   Left: downgoing Cerebellar: Normal finger-to-nose and normal heel-to-shin testing bilaterally Gait: not tested due to safety concerns   Laboratory Studies:   Basic Metabolic Panel:  Recent Labs Lab 02/16/17 2157 02/17/17 1128 02/18/17 0421  NA 137  --  138  K 3.4*  --  3.6  CL 108  --  107  CO2 18*  --  24  GLUCOSE 115*  --  96  BUN 13  --  15  CREATININE 0.74 0.74 0.61  CALCIUM 9.9  --  8.8*    Liver Function Tests:  Recent Labs Lab 02/16/17 2157 02/18/17 0421  AST 37 24  ALT 37 30  ALKPHOS 64 44  BILITOT 0.8 0.8  PROT 8.5* 6.6  ALBUMIN 4.7 3.9   No results for input(s): LIPASE, AMYLASE in the last 168 hours. No results for input(s): AMMONIA in the last 168 hours.  CBC:  Recent Labs Lab 02/16/17 2157 02/17/17 1128 02/18/17 0421  WBC 7.8 6.7 8.9  NEUTROABS 4.5  --   --   HGB 15.7 13.7 13.9  HCT 44.5 39.8* 39.0*  MCV 88.1 90.8 89.6  PLT 261 213 204    Cardiac Enzymes:  Recent Labs Lab 02/16/17 2157  TROPONINI <0.03    BNP: Invalid input(s): POCBNP  CBG: No results for input(s): GLUCAP in the last 168 hours.  Microbiology: No results found for this or  any previous visit.  Coagulation Studies: No results for input(s): LABPROT, INR in the last 72 hours.  Urinalysis: No results for input(s): COLORURINE, LABSPEC, PHURINE, GLUCOSEU, HGBUR, BILIRUBINUR, KETONESUR, PROTEINUR, UROBILINOGEN, NITRITE, LEUKOCYTESUR in the last 168 hours.  Invalid input(s): APPERANCEUR  Lipid Panel:  No results found for: CHOL, TRIG, HDL, CHOLHDL, VLDL, LDLCALC  HgbA1C: No results found for: HGBA1C  Urine Drug Screen:  No results found for: LABOPIA, COCAINSCRNUR, LABBENZ, AMPHETMU, THCU, LABBARB  Alcohol Level: No results for input(s): ETH in the last 168 hours.  Other results: EKG: sinus rhythm at 86 bpm.  Imaging: Ct Angio Head W Or Wo Contrast  Result Date: 02/16/2017 CLINICAL DATA:  Vertigo EXAM: CT ANGIOGRAPHY HEAD TECHNIQUE: Multidetector CT imaging of the head was performed using the standard protocol during bolus administration of intravenous contrast. Multiplanar CT image reconstructions and MIPs were obtained to evaluate the vascular anatomy. CONTRAST:  75 mL Isovue 370 IV COMPARISON:  Head CT 07/23/2016 FINDINGS: CT HEAD Brain: No mass lesion, intraparenchymal hemorrhage or extra-axial collection. No evidence of acute cortical infarct. Brain parenchyma and CSF-containing spaces are normal for age. Vascular: No hyperdense  vessel or unexpected calcification. Skull: Irregularity of the maxillary sinus walls secondary to remote trauma. Sinuses/Orbits: No sinus fluid levels or advanced mucosal thickening. No mastoid effusion. Normal orbits. CTA HEAD Anterior circulation: --Intracranial internal carotid arteries: Normal. --Anterior cerebral arteries: Normal. --Middle cerebral arteries: Normal. --Posterior communicating arteries: Present on the right. Posterior circulation: --Posterior cerebral arteries: Normal. --Superior cerebellar arteries: Normal. --Basilar artery: There is mild narrowing of the distal basilar artery. --Anterior inferior cerebellar arteries:  Not clearly visualized, which is not uncommon. --Posterior inferior cerebellar arteries: Normal. Venous sinuses: As permitted by contrast timing, patent. Anatomic variants: None Delayed phase: No abnormal intracranial enhancement. IMPRESSION: 1. No acute intracranial abnormality. 2. Normal CTA of the intracranial circulation. Electronically Signed   By: Deatra Robinson M.D.   On: 02/16/2017 23:57   Mr Brain Wo Contrast  Result Date: 02/17/2017 CLINICAL DATA:  Four-day history of vertigo and gait disturbance. EXAM: MRI HEAD WITHOUT CONTRAST TECHNIQUE: Multiplanar, multiecho pulse sequences of the brain and surrounding structures were obtained without intravenous contrast. COMPARISON:  CT 02/16/2017 and 07/23/2016. FINDINGS: Brain: Diffusion imaging is negative for acute or subacute infarction. The brainstem is normal. 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 Cerebral hemispheres show mild chronic small-vessel change of the white matter. No cortical or large vessel territory insult. No mass lesion, hemorrhage, hydrocephalus or extra-axial collection. Vascular: Major vessels at the base of the brain show flow. Skull and upper cervical spine: Negative Sinuses/Orbits: Retention cyst right maxillary sinus. No active sinus inflammatory disease. No fluid in the middle ears or mastoids. Orbits negative. 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 IMPRESSION: No acute finding. 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 Mild small vessel change of the cerebral hemispheric white matter. Electronically Signed   By: Paulina Fusi M.D.   On: 02/17/2017 11:33     Assessment/Plan: 61 year old male with a history of TBI presenting with vertigo.  MRI of the brain reviewed and shows no acute changes.  Chronic cerebellar infarct noted.  CTA shows no evidence of posterior circulation thrombus.  Doubt infarct.  Patient without improvement from Meclizine.  Dilantin level less than 2.5.     Recommendations: 1.  Restart Dilantin at home dose of 200mg  daily 2.  Discontinue Meclizine. Start Valium 5mg  q8hours prn vertigo with first dose now.  May increase to 10mg  if necessary. 3.  Vestibular exercises  Thana Farr, MD Neurology [phone] 02/18/2017, 12:46 PM
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
## 2017-02-18 NOTE — Clinical Social Work Note (Signed)
Pt is ready for discharge today. CSW consulted for assistance with medications. RNCM is aware. CSW provided community information for additional assistance. CSW is signing off as no further needs identified.   Dede QuerySarah Shavana Calder, MSW, LCSW  Clinical Social Worker [phone]

## 2017-02-18 NOTE — Discharge Instructions (Signed)

## 2017-02-18 NOTE — Final Progress Note (Signed)
Pt refused his orthostatic  Vital sign to be taken this morning. Attempted to explain and educated pt without any success. Will report to to day shift RN to attempt again.
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
## 2017-02-18 NOTE — Progress Notes (Signed)
Patient discharged home per MD order. Prescriptions given to patient. All discharge instructions given and all questions answered. 

## 2017-02-18 NOTE — Care Management (Signed)
Admitted to this facility with the diagnosis of vertigo. Lives alone. Significant other is [name] [phone]). Last seen Dr. Clydene PughBerkowitz last August 2017. No home health, skilled nursing or home oxygen. Takes care of all basic activities of daily living himself, doesn't drive. Friend helps with errands. [facility] [place].  Physical therapy evaluation completed. Recommending outpatient therapy. States he has no one to take him to outpatient treatments. Rolling walker per [facility]. Gwenette GreetBrenda S Maycol Hoying RN MSN CCM Care Management

## 2017-02-19 NOTE — Discharge Summary (Signed)
[facility] [place] [facility]   PATIENT NAME: Anthony Middleton    MR#:  [id number]  DATE OF BIRTH:  30-Aug-1956  DATE OF ADMISSION:  02/16/2017   ADMITTING PHYSICIAN: Ihor Austin, MD  DATE OF DISCHARGE: 02/18/2017  4:27 PM  PRIMARY CARE PHYSICIAN: BERKOFF, Charlane Ferretti, MD   ADMISSION DIAGNOSIS:  Dizziness [R42] Vertigo [R42] DISCHARGE DIAGNOSIS:  Active Problems:   Vertigo   Dizziness of unknown cause  SECONDARY DIAGNOSIS:   Past Medical History:  Diagnosis Date  . Traumatic brain injury [facility])    HOSPITAL COURSE:  60 year old male with a history of TBI presenting with vertigo.  MRI of the brain shows no acute changes.  Chronic cerebellar infarct noted.  CTA shows no evidence of posterior circulation thrombus.  Doubt infarct.  Patient without improvement from Meclizine.  Dilantin level less than 2.5.    Neurology Recommends discharging him on Dilantin at home dose of 200mg  daily. Discontinue Meclizine. And Start Valium 5mg  q8hours prn vertigo.  May increase to 10mg  if necessary. outpt Vestibular exercises.  *Gait instability - outpt vestibular rehab per PT  *Nausea and vomiting - resolved  *Hypokalemia - repleted  Patient in agreement. DISCHARGE CONDITIONS:  stable CONSULTS OBTAINED:  Treatment Team:  Thana Farr, MD DRUG ALLERGIES:   Allergies  Allergen Reactions  . Tramadol Nausea And Vomiting   DISCHARGE MEDICATIONS:   Allergies as of 02/18/2017      Reactions   Tramadol Nausea And Vomiting      Medication List    TAKE these medications   citalopram 40 MG tablet Commonly known as:  CELEXA Take 40 mg by mouth daily.   diazepam 5 MG tablet Commonly known as:  VALIUM Take 1 tablet (5 mg total) by mouth every 8 (eight) hours as needed (dizziness).   phenytoin 200 MG ER capsule Commonly known as:  DILANTIN Take 1 capsule (200 mg total) by mouth at bedtime. What changed:  medication strength   QUEtiapine 100 MG  tablet Commonly known as:  SEROQUEL Take 100 mg by mouth daily.        DISCHARGE INSTRUCTIONS:   DIET:  Regular diet DISCHARGE CONDITION:  Good ACTIVITY:  Activity as tolerated OXYGEN:  Home Oxygen: No.  Oxygen Delivery: room air DISCHARGE LOCATION:  home   If you experience worsening of your admission symptoms, develop shortness of breath, life threatening emergency, suicidal or homicidal thoughts you must seek medical attention immediately by calling 911 or calling your MD immediately  if symptoms less severe.  You Must read complete instructions/literature along with all the possible adverse reactions/side effects for all the Medicines you take and that have been prescribed to you. Take any new Medicines after you have completely understood and accpet all the possible adverse reactions/side effects.   Please note  You were cared for by a hospitalist during your hospital stay. If you have any questions about your discharge medications or the care you received while you were in the hospital after you are discharged, you can call the unit and asked to speak with the hospitalist on call if the hospitalist that took care of you is not available. Once you are discharged, your primary care physician will handle any further medical issues. Please note that NO REFILLS for any discharge medications will be authorized once you are discharged, as it is imperative that you return to your primary care physician (or establish a relationship with a primary care physician if you do  not have one) for your aftercare needs so that they can reassess your need for medications and monitor your lab values.    On the day of Discharge:  VITAL SIGNS:  Blood pressure (!) 150/95, pulse 68, temperature 97.7 F (36.5 C), temperature source Oral, resp. rate 20, height 6\' 3"  (1.905 m), weight 107 kg (236 lb), SpO2 100 %. PHYSICAL EXAMINATION:  GENERAL:  61 y.o.-year-old patient lying in the bed with no acute  distress.  EYES: Pupils equal, round, reactive to light and accommodation. No scleral icterus. Extraocular muscles intact.  HEENT: Head atraumatic, normocephalic. Oropharynx and nasopharynx clear.  NECK:  Supple, no jugular venous distention. No thyroid enlargement, no tenderness.  LUNGS: Normal breath sounds bilaterally, no wheezing, rales,rhonchi or crepitation. No use of accessory muscles of respiration.  CARDIOVASCULAR: S1, S2 normal. No murmurs, rubs, or gallops.  ABDOMEN: Soft, non-tender, non-distended. Bowel sounds present. No organomegaly or mass.  EXTREMITIES: No pedal edema, cyanosis, or clubbing.  NEUROLOGIC: Cranial nerves II through XII are intact. Muscle strength 5/5 in all extremities. Sensation intact. Gait not checked.  PSYCHIATRIC: The patient is alert and oriented x 3.  SKIN: No obvious rash, lesion, or ulcer.  DATA REVIEW:   CBC  Recent Labs Lab 02/18/17 0421  WBC 8.9  HGB 13.9  HCT 39.0*  PLT 204    Chemistries   Recent Labs Lab 02/18/17 0421  NA 138  K 3.6  CL 107  CO2 24  GLUCOSE 96  BUN 15  CREATININE 0.61  CALCIUM 8.8*  AST 24  ALT 30  ALKPHOS 44  BILITOT 0.8    Follow-up Information    BERKOFF, MOLLY C, MD. Schedule an appointment as soon as possible for a visit in 1 week.   Specialty:  Pediatrics Why:  patient will have to call for appointment Contact information: [id number] [facility] [place] [address] [phone]        [facility]. Schedule an appointment as soon as possible for a visit on 03/07/2017.   Why:  @ 3:20 pm.  Contact information: [address] [place] [address] [phone]           Management plans discussed with the patient, family and they are in agreement.  CODE STATUS: Prior   TOTAL TIME TAKING CARE OF THIS PATIENT: 45 minutes.    Delfino LovettVipul Zohan Shiflet M.D on 02/19/2017 at 11:05 PM  Between 7am to 6pm - Pager - [phone]  After 6pm go to  [email or web address] - Social research officer, governmentpassword EPAS [facility]  [facility] [place] [facility]  Office  [phone]  CC: Primary care physician; Clayborn HeronBERKOFF, MOLLY C, MD   Note: This dictation was prepared with Dragon dictation along with smaller phrase technology. Any transcriptional errors that result from this process are unintentional.
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
